# Patient Record
Sex: Female | Born: 2000 | Race: White | Hispanic: No | Marital: Single | Smoking: Never smoker
Health system: Southern US, Community
[De-identification: ages and names within clinical notes are randomized; demographics above are authoritative.]

## PROBLEM LIST (undated history)

## (undated) DIAGNOSIS — F319 Bipolar disorder, unspecified: Secondary | ICD-10-CM

## (undated) DIAGNOSIS — F840 Autistic disorder: Secondary | ICD-10-CM

## (undated) DIAGNOSIS — T1491XA Suicide attempt, initial encounter: Secondary | ICD-10-CM

## (undated) DIAGNOSIS — F09 Unspecified mental disorder due to known physiological condition: Secondary | ICD-10-CM

## (undated) DIAGNOSIS — H6690 Otitis media, unspecified, unspecified ear: Secondary | ICD-10-CM

## (undated) HISTORY — PX: TONSILLECTOMY: SUR1361

## (undated) HISTORY — PX: OTHER SURGICAL HISTORY: SHX169

---
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
## 2019-06-20 ENCOUNTER — Other Ambulatory Visit: Payer: Self-pay

## 2019-06-20 DIAGNOSIS — Z20822 Contact with and (suspected) exposure to covid-19: Secondary | ICD-10-CM

## 2019-06-22 LAB — NOVEL CORONAVIRUS, NAA: SARS-CoV-2, NAA: NOT DETECTED

## 2019-10-30 ENCOUNTER — Ambulatory Visit: Payer: Medicaid Other | Attending: Internal Medicine

## 2019-10-30 DIAGNOSIS — Z23 Encounter for immunization: Secondary | ICD-10-CM

## 2019-10-30 NOTE — Progress Notes (Signed)
   Covid-19 Vaccination Clinic  Name:  Tynlee Bayle    MRN: [id number] DOB: 03-19-2001  10/30/2019  Ms. Governale was observed post Covid-19 immunization for 15 minutes without incident. She was provided with Vaccine Information Sheet and instruction to access the V-Safe system.   Ms. Perham was instructed to call 911 with any severe reactions post vaccine: Marland Kitchen Difficulty breathing  . Swelling of face and throat  . A fast heartbeat  . A bad rash all over body  . Dizziness and weakness   Immunizations Administered    Name Date Dose VIS Date Route   Pfizer COVID-19 Vaccine 10/30/2019 10:50 AM 0.3 mL 07/07/2019 Intramuscular   Manufacturer: [employer], [employer]   Lot: [id number]   NDC: [address]-1

## 2019-11-22 ENCOUNTER — Ambulatory Visit: Payer: Medicaid Other | Attending: Internal Medicine

## 2019-11-22 DIAGNOSIS — Z23 Encounter for immunization: Secondary | ICD-10-CM

## 2019-11-22 NOTE — Progress Notes (Signed)
   Covid-19 Vaccination Clinic  Name:  Martha Harrison    MRN: [id number] DOB: 12-22-2000  11/22/2019  Martha Harrison was observed post Covid-19 immunization for 15 minutes without incident. She was provided with Vaccine Information Sheet and instruction to access the V-Safe system.   Martha Harrison was instructed to call 911 with any severe reactions post vaccine: Marland Kitchen Difficulty breathing  . Swelling of face and throat  . A fast heartbeat  . A bad rash all over body  . Dizziness and weakness   Immunizations Administered    Name Date Dose VIS Date Route   Pfizer COVID-19 Vaccine 11/22/2019 11:00 AM 0.3 mL 09/20/2018 Intramuscular   Manufacturer: [employer], [employer]   Lot: [id number]   NDC: [address]-3

## 2020-02-18 ENCOUNTER — Other Ambulatory Visit: Payer: Self-pay

## 2020-02-18 ENCOUNTER — Encounter (HOSPITAL_COMMUNITY): Payer: Self-pay | Admitting: Emergency Medicine

## 2020-02-18 ENCOUNTER — Emergency Department (HOSPITAL_COMMUNITY)
Admission: EM | Admit: 2020-02-18 | Discharge: 2020-02-19 | Disposition: A | Discharge status: Other Acute Inpatient Hospital | Payer: Medicaid Other | Attending: Emergency Medicine | Admitting: Emergency Medicine

## 2020-02-18 DIAGNOSIS — F321 Major depressive disorder, single episode, moderate: Secondary | ICD-10-CM | POA: Insufficient documentation

## 2020-02-18 DIAGNOSIS — R45851 Suicidal ideations: Secondary | ICD-10-CM | POA: Insufficient documentation

## 2020-02-18 DIAGNOSIS — Z20822 Contact with and (suspected) exposure to covid-19: Secondary | ICD-10-CM | POA: Insufficient documentation

## 2020-02-18 DIAGNOSIS — F840 Autistic disorder: Secondary | ICD-10-CM | POA: Diagnosis not present

## 2020-02-18 LAB — CBC WITH DIFFERENTIAL/PLATELET
Abs Immature Granulocytes: 0.02 10*3/uL (ref 0.00–0.07)
Basophils Absolute: 0.1 10*3/uL (ref 0.0–0.1)
Basophils Relative: 1 %
Eosinophils Absolute: 0.1 10*3/uL (ref 0.0–0.5)
Eosinophils Relative: 2 %
HCT: 38.8 % (ref 36.0–46.0)
Hemoglobin: 12.7 g/dL (ref 12.0–15.0)
Immature Granulocytes: 0 %
Lymphocytes Relative: 40 %
Lymphs Abs: 3.1 10*3/uL (ref 0.7–4.0)
MCH: 29.3 pg (ref 26.0–34.0)
MCHC: 32.7 g/dL (ref 30.0–36.0)
MCV: 89.4 fL (ref 80.0–100.0)
Monocytes Absolute: 0.7 10*3/uL (ref 0.1–1.0)
Monocytes Relative: 9 %
Neutro Abs: 3.8 10*3/uL (ref 1.7–7.7)
Neutrophils Relative %: 48 %
Platelets: 165 10*3/uL (ref 150–400)
RBC: 4.34 MIL/uL (ref 3.87–5.11)
RDW: 12.4 % (ref 11.5–15.5)
WBC: 7.8 10*3/uL (ref 4.0–10.5)
nRBC: 0 % (ref 0.0–0.2)

## 2020-02-18 LAB — ACETAMINOPHEN LEVEL: Acetaminophen (Tylenol), Serum: <10 ug/mL — ABNORMAL LOW (ref 10–30)

## 2020-02-18 LAB — COMPREHENSIVE METABOLIC PANEL
ALT: 16 U/L (ref 0–44)
AST: 19 U/L (ref 15–41)
Albumin: 3.8 g/dL (ref 3.5–5.0)
Alkaline Phosphatase: 51 U/L (ref 38–126)
Anion gap: 13 (ref 5–15)
BUN: 11 mg/dL (ref 6–20)
CO2: 21 mmol/L — ABNORMAL LOW (ref 22–32)
Calcium: 9.3 mg/dL (ref 8.9–10.3)
Chloride: 106 mmol/L (ref 98–111)
Creatinine, Ser: 0.51 mg/dL (ref 0.44–1.00)
GFR calc Af Amer: >60 mL/min (ref >60)
GFR calc non Af Amer: >60 mL/min (ref >60)
Glucose, Bld: 88 mg/dL (ref 70–99)
Potassium: 3.8 mmol/L (ref 3.5–5.1)
Sodium: 140 mmol/L (ref 135–145)
Total Bilirubin: 0.4 mg/dL (ref 0.3–1.2)
Total Protein: 7.2 g/dL (ref 6.5–8.1)

## 2020-02-18 LAB — CBC
HCT: 43.1 % (ref 36.0–46.0)
Hemoglobin: 14.1 g/dL (ref 12.0–15.0)
MCH: 29.6 pg (ref 26.0–34.0)
MCHC: 32.7 g/dL (ref 30.0–36.0)
MCV: 90.4 fL (ref 80.0–100.0)
Platelets: 159 10*3/uL (ref 150–400)
RBC: 4.77 MIL/uL (ref 3.87–5.11)
RDW: 12.4 % (ref 11.5–15.5)
WBC: 7.8 10*3/uL (ref 4.0–10.5)
nRBC: 0 % (ref 0.0–0.2)

## 2020-02-18 LAB — RAPID URINE DRUG SCREEN, HOSP PERFORMED
Amphetamines: NOT DETECTED
Barbiturates: NOT DETECTED
Benzodiazepines: NOT DETECTED
Cocaine: NOT DETECTED
Opiates: NOT DETECTED
Tetrahydrocannabinol: NOT DETECTED

## 2020-02-18 LAB — VALPROIC ACID LEVEL: Valproic Acid Lvl: 34 ug/mL — ABNORMAL LOW (ref 50.0–100.0)

## 2020-02-18 LAB — I-STAT BETA HCG BLOOD, ED (MC, WL, AP ONLY): I-stat hCG, quantitative: <5 m[IU]/mL (ref <5)

## 2020-02-18 LAB — ETHANOL: Alcohol, Ethyl (B): <10 mg/dL (ref <10)

## 2020-02-18 LAB — SALICYLATE LEVEL: Salicylate Lvl: <7 mg/dL — ABNORMAL LOW (ref 7.0–30.0)

## 2020-02-18 MED ORDER — FOLIC ACID 1 MG PO TABS
1.0000 mg | ORAL_TABLET | Freq: Every day | ORAL | Status: DC
  Administered 2020-02-18 – 2020-02-19 (×2): 1 mg via ORAL
  Filled 2020-02-18 (×2): qty 1

## 2020-02-18 MED ORDER — ADULT MULTIVITAMIN W/MINERALS CH
1.0000 | ORAL_TABLET | Freq: Every day | ORAL | Status: DC
  Administered 2020-02-18 – 2020-02-19 (×2): 1 via ORAL
  Filled 2020-02-18 (×2): qty 1

## 2020-02-18 MED ORDER — CLOZAPINE 100 MG PO TABS
250.0000 mg | ORAL_TABLET | Freq: Two times a day (BID) | ORAL | Status: DC
  Administered 2020-02-18 – 2020-02-19 (×2): 250 mg via ORAL
  Filled 2020-02-18 (×3): qty 3

## 2020-02-18 MED ORDER — DIVALPROEX SODIUM 125 MG PO CSDR
500.0000 mg | DELAYED_RELEASE_CAPSULE | Freq: Two times a day (BID) | ORAL | Status: DC
  Administered 2020-02-18 – 2020-02-19 (×2): 500 mg via ORAL
  Filled 2020-02-18 (×3): qty 4

## 2020-02-18 MED ORDER — ALPRAZOLAM 0.5 MG PO TABS
0.5000 mg | ORAL_TABLET | Freq: Every evening | ORAL | Status: DC | PRN
  Administered 2020-02-18: 0.5 mg via ORAL
  Filled 2020-02-18: qty 1

## 2020-02-18 MED ORDER — MELATONIN 5 MG PO TABS
2.5000 mg | ORAL_TABLET | Freq: Every day | ORAL | Status: DC
  Administered 2020-02-18: 2.5 mg via ORAL
  Filled 2020-02-18: qty 1

## 2020-02-18 MED ORDER — DOCUSATE SODIUM 100 MG PO CAPS
100.0000 mg | ORAL_CAPSULE | Freq: Every day | ORAL | Status: DC
  Administered 2020-02-18: 100 mg via ORAL
  Filled 2020-02-18: qty 1

## 2020-02-18 NOTE — ED Notes (Signed)
Pt's belonging bag placed in Locker [address]2.

## 2020-02-18 NOTE — ED Notes (Signed)
Pt's dinner ordered °

## 2020-02-18 NOTE — ED Notes (Signed)
Regular lunch tray ordered 

## 2020-02-18 NOTE — ED Provider Notes (Signed)
[facility] EMERGENCY DEPARTMENT Provider Note   CSN: [id number] Arrival date & time: 02/18/20  5643     History Chief Complaint  Patient presents with  . Suicidal    Martha Harrison is a 19 y.o. female.  HPI 19 year old female presents with suicidal thoughts.  History is taken from patient and dad at the bedside.  Patient has autism and often acts out.  Often when she gets upset she will have suicidal thoughts that typically go away by that night or the next morning.  This occurred yesterday and at one point she was agitated and threw her pill box.  Has been endorsing suicidal thoughts and these did not go away today.  Still feels suicidal.  She has not taken her meds since last night or this morning.  Has not had any signs/symptoms of illness otherwise. No plan for suicide.  Past Medical History:  Diagnosis Date  . Autism   . Bipolar 1 disorder (HCC)   . Cognitive disorder   . Otitis   . Suicide attempt [facility])     Patient Active Problem List   Diagnosis Date Noted  . Auditory hallucination   . Suicidal ideation   . Psychoses (HCC)   . Pervasive developmental disorder   . Depression 02/05/2015    Past Surgical History:  Procedure Laterality Date  . TONSILLECTOMY    . tubes in ears       OB History   No obstetric history on file.     No family history on file.  Social History   Tobacco Use  . Smoking status: Never Smoker  Substance Use Topics  . Alcohol use: No  . Drug use: No    Home Medications Prior to Admission medications   Medication Sig Start Date End Date Taking? Authorizing Provider  ALPRAZolam (XANAX) 0.25 MG tablet Take 0.25 mg by mouth 2 (two) times daily as needed. 01/26/20  Yes [provider]  cloZAPine (CLOZARIL) 100 MG tablet Take 250 mg by mouth 2 (two) times daily.    Yes [provider]  divalproex (DEPAKOTE ER) 500 MG 24 hr tablet Take 500 mg by mouth 2 (two) times daily. 02/12/20  Yes [provider]  docusate sodium (COLACE) 100 MG capsule Take 100 mg by mouth at bedtime.   Yes [provider]  folic acid (FOLVITE) 1 MG tablet Take 1 mg by mouth daily.   Yes [provider]  levOCARNitine L-Tartrate (L-CARNITINE) 500 MG CAPS Take 1,000 mg by mouth in the morning and at bedtime.   Yes [provider]  melatonin 5 MG TABS Take 5 mg by mouth at bedtime.    Yes [provider]  Multiple Vitamin (MULTIVITAMIN ADULT PO) Take 1 tablet by mouth daily.   Yes [provider]  senna (SENOKOT) 8.6 MG TABS tablet Take 2 tablets by mouth every evening.   Yes [provider]  SIMPESSE 0.15-0.03 &0.01 MG tablet Take 1 tablet by mouth daily. 01/26/20  Yes [provider]    Allergies    Patient has no known allergies.  Review of Systems   Review of Systems  Constitutional: Negative for fever.  Psychiatric/Behavioral: Positive for suicidal ideas.  All other systems reviewed and are negative.   Physical Exam Updated Vital Signs BP (!) 127/90 (BP Location: Right Arm)   Pulse (!) 126   Temp 97.6 F (36.4 C) (Oral)   Resp 16   Ht 4\' 11"  (1.499 m)  LMP 02/04/2020   SpO2 100%   Physical Exam Vitals and nursing note reviewed.  Constitutional:      General: She is not in acute distress.    Appearance: She is well-developed. She is not ill-appearing or diaphoretic.  HENT:     Head: Normocephalic and atraumatic.     Right Ear: External ear normal.     Left Ear: External ear normal.     Nose: Nose normal.  Eyes:     General:        Right eye: No discharge.        Left eye: No discharge.  Cardiovascular:     Rate and Rhythm: Regular rhythm. Tachycardia present.     Heart sounds: Normal heart sounds.  Pulmonary:     Effort: Pulmonary effort is normal.     Breath sounds: Normal breath sounds.  Abdominal:     General: There is no distension.  Skin:    General: Skin is warm and dry.  Neurological:     Mental  Status: She is alert.  Psychiatric:        Mood and Affect: Mood is not anxious.        Thought Content: Thought content includes suicidal ideation.     ED Results / Procedures / Treatments   Labs (all labs ordered are listed, but only abnormal results are displayed) Labs Reviewed  COMPREHENSIVE METABOLIC PANEL - Abnormal; Notable for the following components:      Result Value   CO2 21 (*)    All other components within normal limits  SALICYLATE LEVEL - Abnormal; Notable for the following components:   Salicylate Lvl <7.0 (*)    All other components within normal limits  ACETAMINOPHEN LEVEL - Abnormal; Notable for the following components:   Acetaminophen (Tylenol), Serum <10 (*)    All other components within normal limits  VALPROIC ACID LEVEL - Abnormal; Notable for the following components:   Valproic Acid Lvl 34 (*)    All other components within normal limits  SARS CORONAVIRUS 2 BY RT PCR (HOSPITAL ORDER, PERFORMED IN [place] [facility])  ETHANOL  CBC  RAPID URINE DRUG SCREEN, HOSP PERFORMED  CBC WITH DIFFERENTIAL/PLATELET  I-STAT BETA HCG BLOOD, ED ([facility], [facility], [facility] ONLY)    EKG None  Radiology No results found.  Procedures Procedures (including critical care time)  Medications Ordered in ED Medications  ALPRAZolam (XANAX) tablet 0.5 mg (has no administration in time range)  cloZAPine (CLOZARIL) tablet 150-200 mg (has no administration in time range)  divalproex (DEPAKOTE SPRINKLE) capsule 500 mg (has no administration in time range)  docusate sodium (COLACE) capsule 100 mg (has no administration in time range)  folic acid (FOLVITE) tablet 1 mg (has no administration in time range)  melatonin tablet 2.5 mg (has no administration in time range)  multivitamin with minerals tablet 1 tablet (has no administration in time range)    ED Course  I have reviewed the triage vital signs and the nursing notes.  Pertinent labs & imaging results that were available  during my care of the patient were reviewed by me and considered in my medical decision making (see chart for details).    MDM Rules/Calculators/A&P                          Patient is actively suicidal though no plan.  She is quite tachycardic though self describes her symptoms as anxiety.  She has been through  this before.  Dad is at the bedside.  We will restart her home meds and consult TTS.  Her heart rate is high though again that is probably from the anxiety as her other labs are benign.  The patient has been placed in psychiatric observation due to the need to provide a safe environment for the patient while obtaining psychiatric consultation and evaluation, as well as ongoing medical and medication management to treat the patient's condition.  The patient has not been placed under full IVC at this time.  Final Clinical Impression(s) / ED Diagnoses Final diagnoses:  None    Rx / DC Orders ED Discharge Orders    None       Pricilla Loveless, MD 02/18/20 [phone]

## 2020-02-18 NOTE — BH Assessment (Addendum)
Comprehensive Clinical Assessment (CCA) Screening, Triage and Referral Note  02/18/2020 Martha Harrison [id number]  Visit Diagnosis: F32.1, Major depressive disorder, Single episode, Moderate  Martha Harrison is a 19 year old patient who was brought to [facility] via her father (grandfather) due to pt expressing having thoughts about wanting to kill herself. Pt shares she was angry last night and, due to this, threw her pill box across the room. When asked what would happen were she to be discharged home, pt states she might yell, throw things, or bite myself.  Pt was unable to share why she was upset last night/why she threw her pill bottle. She shares she also refused to take her medication last night and this morning. Pt expressed frustration that she has to take medication "all the time and I'm sick of it!" Pt expressed an understanding that many people have to take medication multiple times/day to live and that she is not alone in this frustration; it's just something she might need to live with.  Pt gave verbal consent for clinician to contact her father, whom is also her legal guardian. Pt's father states pt gets upset at times, but that typically she will calm down after several hours and will feel better and that, eventually, she'll continue on as if nothing happened. Pt shares that, today, pt could not be convinced that she did not need to go to the hospital; she told clinician several times, "I don't feel safe at home" because she might react by biting herself, yelling, or throwing things.  Pt is oriented x5. Her recent and remote memory is intact. Pt was cooperative throughout the assessment, with the exception of becoming loud when discussing not wanting to return home. Pt's insight, judgement, and impulse control is fair - poor at this time.  Patient Reported Information How did you hear about [place]? Family/Friend   Referral name: Darlina Rumpf, grandfather   Referral phone number:  [phone]  Whom do you see for routine medical problems? Primary Care   Practice[facility]Facility Name: [facility]   Practice/Facility Phone Number: No data recorded  Name of Contact: [facility]   Contact Number: [facility]   Contact Fax Number: [facility]   Prescriber Name: [facility]   Prescriber Address (if known): [facility]  What Is the Reason for Your Visit/Call Today? [facility]  How Long Has This Been Causing You Problems? <Week  Have You Recently Been in Any Inpatient Treatment (Hospital/Detox/Crisis Center/28-Day Program)? No   Name/Location of Program/Hospital:No data recorded  How Long Were You There? No data recorded  When Were You Discharged? No data recorded Have You Ever Received Services From [facility] Before? No   Who Do You See at [facility]? No data recorded Have You Recently Had Any Thoughts About Hurting Yourself? Yes   Are You Planning to Commit Suicide/Harm Yourself At This time?  No  Have you Recently Had Thoughts About Hurting Someone Karolee Ohs? No   Explanation: No data recorded Have You Used Any Alcohol or Drugs in the Past 24 Hours? No   How Long Ago Did You Use Drugs or Alcohol?  No data recorded  What Did You Use and How Much? No data recorded What Do You Feel Would Help You the Most Today? Other (Comment) (Unsure)  Do You Currently Have a Therapist/Psychiatrist? Yes   Name of Therapist/Psychiatrist: Pt is currently seen at [facility] for psychiatry for the last 10 years.   Have You Been Recently Discharged From Any Office Practice or Programs? No   Explanation of  Discharge From Practice/Program:  No data recorded    CCA Screening Triage Referral Assessment Type of Contact: Tele-Assessment   Is this Initial or Reassessment? Initial Assessment   Date Telepsych consult ordered in [facility]:  No data recorded  Time Telepsych consult ordered in [facility]:  No data recorded Patient Reported Information Reviewed? No data recorded  Patient Left Without Being Seen? No data recorded  Reason for  Not Completing Assessment: No data recorded Collateral Involvement: No data recorded Does Patient Have a Court Appointed Legal Guardian? No data recorded  Name and Contact of Legal Guardian:  No data recorded If Minor and Not Living with Parent(s), Who has Custody? No data recorded Is CPS involved or ever been involved? No data recorded Is APS involved or ever been involved? No data recorded Patient Determined To Be At Risk for Harm To Self or Others Based on Review of Patient Reported Information or Presenting Complaint? No data recorded  Method: No data recorded  Availability of Means: No data recorded  Intent: No data recorded  Notification Required: No data recorded  Additional Information for Danger to Others Potential:  No data recorded  Additional Comments for Danger to Others Potential:  No data recorded  Are There Guns or Other Weapons in Your Home?  No data recorded   Types of Guns/Weapons: No data recorded   Are These Weapons Safely Secured?                              No data recorded   Who Could Verify You Are Able To Have These Secured:    No data recorded Do You Have any Outstanding Charges, Pending Court Dates, Parole/Probation? No data recorded Contacted To Inform of Risk of Harm To Self or Others: No data recorded Location of Assessment: No data recorded Does Patient Present under Involuntary Commitment? No data recorded  IVC Papers Initial File Date: No data recorded  [place] of Residence: No data recorded Patient Currently Receiving the Following Services: No data recorded  Determination of Need: No data recorded  Options For Referral: Shuvon Rankin, NP, reviewed pt's chart and information and determined pt should be observed overnight and re-assessed by psych in the morning. This information was provided to pt's nurse, Britt Boozer RN, at [phone].  Ralph Dowdy, LMFT

## 2020-02-18 NOTE — ED Notes (Signed)
Pt joyful and happy, chatting with all staff. Pt pleasant and cooperative at this time.

## 2020-02-18 NOTE — ED Notes (Signed)
Pt has changed into burgundy scrubs.  Belongings bagged, labeled, and removed from room.  Security notified to wand pt.

## 2020-02-18 NOTE — ED Triage Notes (Signed)
Pt states she has been stating that she is suicidal because she gets upset but has no intentions of acting on it.  Denies suicidal plan.  Dad reports pt hasn't taken medications since last night.  She threw her pill box and said she didn't want to take medications.  Pt had been requesting for dad to bring her to hospital but he was trying to wait until Monday to follow up with her doctor.  States that normally when pt has outburst she will wake up the next morning and be apologetic but this morning she continued to say she was suicidal and request to come to hospital.

## 2020-02-18 NOTE — ED Notes (Signed)
Per BH, pt will be observed overnight and be reassessed tomorrow.

## 2020-02-19 ENCOUNTER — Ambulatory Visit (HOSPITAL_COMMUNITY)
Admission: EM | Admit: 2020-02-19 | Discharge: 2020-02-20 | Disposition: A | Discharge status: Home / Self Care | Payer: Medicaid Other | Source: Home / Self Care

## 2020-02-19 ENCOUNTER — Other Ambulatory Visit: Payer: Self-pay

## 2020-02-19 DIAGNOSIS — Z915 Personal history of self-harm: Secondary | ICD-10-CM | POA: Insufficient documentation

## 2020-02-19 DIAGNOSIS — Z79899 Other long term (current) drug therapy: Secondary | ICD-10-CM | POA: Insufficient documentation

## 2020-02-19 DIAGNOSIS — R45851 Suicidal ideations: Secondary | ICD-10-CM

## 2020-02-19 DIAGNOSIS — F419 Anxiety disorder, unspecified: Secondary | ICD-10-CM | POA: Insufficient documentation

## 2020-02-19 DIAGNOSIS — F840 Autistic disorder: Secondary | ICD-10-CM | POA: Insufficient documentation

## 2020-02-19 DIAGNOSIS — Z793 Long term (current) use of hormonal contraceptives: Secondary | ICD-10-CM | POA: Insufficient documentation

## 2020-02-19 DIAGNOSIS — F314 Bipolar disorder, current episode depressed, severe, without psychotic features: Secondary | ICD-10-CM | POA: Insufficient documentation

## 2020-02-19 DIAGNOSIS — F09 Unspecified mental disorder due to known physiological condition: Secondary | ICD-10-CM | POA: Insufficient documentation

## 2020-02-19 LAB — SARS CORONAVIRUS 2 BY RT PCR (HOSPITAL ORDER, PERFORMED IN ~~LOC~~ HOSPITAL LAB): SARS Coronavirus 2: NEGATIVE

## 2020-02-19 MED ORDER — MAGNESIUM HYDROXIDE 400 MG/5ML PO SUSP: 30.0000 mL | Freq: Every day | ORAL | Status: DC | PRN

## 2020-02-19 MED ORDER — LEVONORGEST-ETH ESTRAD 91-DAY 0.15-0.03 &0.01 MG PO TABS: 1.0000 | ORAL_TABLET | Freq: Every day | ORAL | Status: DC

## 2020-02-19 MED ORDER — SENNA 8.6 MG PO TABS: 2.0000 | ORAL_TABLET | Freq: Every evening | ORAL | Status: DC

## 2020-02-19 MED ORDER — ADULT MULTIVITAMIN W/MINERALS CH
1.0000 | ORAL_TABLET | Freq: Every day | ORAL | Status: DC
  Administered 2020-02-20: 1 via ORAL
  Filled 2020-02-19: qty 1

## 2020-02-19 MED ORDER — DIVALPROEX SODIUM ER 500 MG PO TB24
500.0000 mg | ORAL_TABLET | Freq: Two times a day (BID) | ORAL | Status: DC
  Administered 2020-02-19 – 2020-02-20 (×2): 500 mg via ORAL
  Filled 2020-02-19 (×2): qty 1

## 2020-02-19 MED ORDER — FOLIC ACID 1 MG PO TABS
1.0000 mg | ORAL_TABLET | Freq: Every day | ORAL | Status: DC
  Filled 2020-02-19 (×2): qty 1

## 2020-02-19 MED ORDER — MELATONIN 5 MG PO TABS
5.0000 mg | ORAL_TABLET | Freq: Every day | ORAL | Status: DC
  Administered 2020-02-19: 5 mg via ORAL
  Filled 2020-02-19: qty 1

## 2020-02-19 MED ORDER — DOCUSATE SODIUM 100 MG PO CAPS
100.0000 mg | ORAL_CAPSULE | Freq: Every day | ORAL | Status: DC
  Administered 2020-02-19: 100 mg via ORAL
  Filled 2020-02-19: qty 1

## 2020-02-19 MED ORDER — ACETAMINOPHEN 325 MG PO TABS: 650.0000 mg | ORAL_TABLET | Freq: Four times a day (QID) | ORAL | Status: DC | PRN

## 2020-02-19 MED ORDER — L-CARNITINE 500 MG PO CAPS: 1000.0000 mg | ORAL_CAPSULE | Freq: Two times a day (BID) | ORAL | Status: DC

## 2020-02-19 MED ORDER — CLOZAPINE 25 MG PO TABS
250.0000 mg | ORAL_TABLET | Freq: Two times a day (BID) | ORAL | Status: DC
  Administered 2020-02-19 – 2020-02-20 (×2): 250 mg via ORAL
  Filled 2020-02-19 (×2): qty 2

## 2020-02-19 MED ORDER — ALUM & MAG HYDROXIDE-SIMETH 200-200-20 MG/5ML PO SUSP: 30.0000 mL | ORAL | Status: DC | PRN

## 2020-02-19 MED ORDER — ALPRAZOLAM 0.25 MG PO TABS
0.2500 mg | ORAL_TABLET | Freq: Every evening | ORAL | Status: DC | PRN
  Administered 2020-02-19: 0.25 mg via ORAL
  Filled 2020-02-19: qty 1

## 2020-02-19 NOTE — ED Triage Notes (Signed)
Pt transferred from [facility] via [employer]. Endorsing VH of monster with big mouth in doorway of assessment room as well as anxiety. Denies SI at this time.

## 2020-02-19 NOTE — ED Notes (Signed)
Lunch ordered 

## 2020-02-19 NOTE — ED Notes (Signed)
Report called to [facility] and given to [place], [place]

## 2020-02-19 NOTE — ED Notes (Signed)
Pt eating dinner at this time

## 2020-02-19 NOTE — ED Provider Notes (Signed)
Emergency Medicine Observation Re-evaluation Note  Martha Harrison is a 19 y.o. female, seen on rounds today.  Pt initially presented to the ED for complaints of Suicidal Currently, the patient is sleeping.  Physical Exam  BP 120/78 (BP Location: Right Arm)   Pulse (!) 114   Temp 98.1 F (36.7 C) (Oral)   Resp 18   Ht 4\' 11"  (1.499 m)   LMP 02/04/2020   SpO2 94%  Physical Exam Constitutional:      Comments: sleeping  Pulmonary:     Effort: Pulmonary effort is normal.     ED Course / MDM  EKG:    I have reviewed the labs performed to date as well as medications administered while in observation. Patient is noted to have continued documented tachycardia. Discussed with RN caring for patient. It is reported that HR normalizes after administration of xanax, appears to be attributed to anxiety. Recheck this morning is 101. Requested RN document VS.  Plan  Psych evaluated patient this morning. Current plan is for transfer to [facility] for stabilization, pending COVID test result. Patient is not under full IVC at this time.   Maximilian Tallo, [facility], PA-C 02/19/20 1225    02/21/20, MD 02/19/20 1747

## 2020-02-19 NOTE — Progress Notes (Addendum)
2nd shift [facility] WED CSW (acting as disposition today) received a call from the 2nd shift ED CSW at [facility] that pt's grandfather (and acting father, per the [facility] ED CSW) is upset that no one has contacted him regarding pt's disposition.  CSW will continue to follow for D/C needs.  Dorothe Pea. Jasmeet Manton  MSW, LCSW, LCAS, CCS Transitions of Care Clinical Social Worker Care Coordination Department Ph: [phone]

## 2020-02-19 NOTE — ED Notes (Signed)
Multiple attempts to contact [facility] to give report or find out when she can come; unsuccessful at getting thru

## 2020-02-19 NOTE — ED Notes (Signed)
Pt belongings in locker #1. 

## 2020-02-19 NOTE — ED Notes (Addendum)
RN called [facility] to check on covid test results. They say they dont have specimen. RN will recollect

## 2020-02-19 NOTE — Progress Notes (Signed)
REMS Clozapine program Update: Patient & MD registered; Labs entered and acceptable.  Per Clozapine REMS program, acceptable to continue medication. ANC monitoring required every 4 weeks as outpatient.    Junita Push, PharmD, BCPS 02/19/2020@10 :15 PM

## 2020-02-19 NOTE — Progress Notes (Addendum)
CSW called pt's grandfather and legal guardian Martha Harrison and legal guardian who has concerns that it is to be understood that he is the legal guardian and changes regarding pt's disposition and medication are not to be changed without his consent as there have been problems with this having been done before and that pt's current tx which has been markedly better than in the past are the result of the medication changes that were implemented at [facility] and were approved by pt's current psychiatrist.  Per pt's Grandfather/LG, pt's current provider (psychiatrist)  info in the community can be obtained from him (LG)  at ph: [phone].  6:18 PM Psychiatry updated at [facility].  CSW will continue to follow for D/C needs.  Martha Harrison. Martha Harrison  MSW, LCSW, LCAS, CCS Transitions of Care Clinical Social Worker Care Coordination Department Ph: [phone]

## 2020-02-19 NOTE — ED Provider Notes (Addendum)
Behavioral Health Admission H&P [facility](FBC & OBS)  Date: 02/20/20 Patient Name: Martha Harrison MRN: [id number] Chief Complaint:  Chief Complaint  Patient presents with  . Suicidal  . Anxiety  . Hallucinations      Diagnoses:  Final diagnoses:  Bipolar 1 disorder, depressed, severe (HCC)  Suicidal ideation    HPI: Martha Barrioslizabeth Bambach is a 19 year old female who presented to [facility] due to suicidal thoughts.  She has a history of autism, bipolar 1 disorder, cognitive disorder, and previous suicide attempt.  [facility].  From Psychiatric Evaluation 02/19/2020:  Psychiatric evaluation: Martha Barrioslizabeth Bonaventure is a 19 year old female with a history significantly for Autism, Bipolar 1 disorder, Cognitive disorder, and suicide attmpt per chart review.  [facility] and per chart review, it was noted that when she gets upset she will have suicidal thoughts that typically go away by that night or the next morning. During this evaluation, patient was alert and oriented, calm and cooperative although cognitive limitations were noted making her a poor historian. She continued to endorse suicidal thoughts although stated," I don't have a plan." She did however state," If I go home I will try to hurt myself because I don't feel safe being there." She could not explain why she did not feel safe yet, continued to refer to feeling upset and throwing her box  of pills prior to going to the ED. It was difficult for her to express what caused her to feel upset although she did mention that she was frustrated with her medications. When asked if she had tried to harm herself in the past she replied," A long time ago when I watched five nights of Freddie. That's when I scratched myself and that's when I had illusions and had to be sent to 7 hospitals." Again, I do believe that patients cognitive limitations are so severe that she may not be able to  understand some of the questions. She denied homicidal thoughts and AVH. She stated that she receives outpatient psychiatric services but is unable to say with whom. She denied substance abuse or use.    On evaluation this evening, the patient is alert and oriented x4.  Her speech is clear and coherent, normal pace, decreased in volume.  She reports her mood is depressed and anxious.  Her affect is congruent with mood.  She reports that [facility] that she saw a character with a wide mouth.  She states that she is now tired and ready for bed, so she is not having visual hallucinations.  She denies auditory hallucinations.  She does not appear to be responding to internal stimuli.  She continues to endorse suicidal thoughts.  Denies any intent or plan.  She is able to verbally contract for safety while in the facility.  She denies homicidal ideations.  She denies substance abuse.  Urine drug screen negative.  She requests to end the evaluation, so she can get some sleep.  Per chart review the patient's grandfather, Darlina RumpfDavid Cohen, is the legal guardian and request any changes to medications or disposition be discussed with him prior to any changes.  PHQ 2-9:     [facility]  C-SSRS RISK CATEGORY Low Risk Low Risk       Total Time spent with patient: 15 minutes  Musculoskeletal  Strength &  Muscle Tone: within normal limits Gait & Station: normal Patient leans: N/A  Psychiatric Specialty Exam  Presentation General Appearance: Casual;Fairly Groomed  Eye Contact:Minimal  Speech:Clear and Coherent;Normal Rate  Speech Volume:Decreased  Handedness:No data recorded  Mood and Affect  Mood:Depressed;Anxious  Affect:Congruent;Depressed   Thought Process  Thought Processes:Coherent;Linear  Descriptions of Associations:Intact  Orientation:Full (Time,  Place and Person)  Thought Content:Logical  Hallucinations:Hallucinations: Visual;Other (comment) (Denies auditory hallucinations) Description of Visual Hallucinations: states that she saw a character with a wide mouth when she first arrived to [facility]. States now that she is tired and sleepy she does not have any hallucinations  Ideas of Reference:None  Suicidal Thoughts:Suicidal Thoughts: No  Homicidal Thoughts:Homicidal Thoughts: No   Sensorium  Memory:Immediate Good;Recent Fair;Remote Fair  Judgment:Intact  Insight:Present   Executive Functions  Concentration:Fair  Attention Span:Fair  Recall:Fair  Fund of Knowledge:Fair  Language:Good   Psychomotor Activity  Psychomotor Activity:Psychomotor Activity: Normal   Assets  Assets:Communication Skills;Desire for Improvement;Financial Resources/Insurance;Housing;Leisure Time;Physical Health   Sleep  Sleep:Sleep: Fair   Physical Exam Constitutional:      General: She is not in acute distress.    Appearance: She is obese. She is not ill-appearing, toxic-appearing or diaphoretic.  HENT:     Head: Normocephalic.     Right Ear: External ear normal.     Left Ear: External ear normal.  Eyes:     Pupils: Pupils are equal, round, and reactive to light.  Cardiovascular:     Rate and Rhythm: Normal rate.  Pulmonary:     Effort: Pulmonary effort is normal. No respiratory distress.  Musculoskeletal:        General: Normal range of motion.  Neurological:     Mental Status: She is alert and oriented to person, place, and time.  Psychiatric:        Attention and Perception: She perceives visual hallucinations.        Mood and Affect: Mood is anxious and depressed.        Thought Content: Thought content is not paranoid or delusional. Thought content does not include homicidal or suicidal ideation.    Review of Systems  Constitutional: Negative for chills, diaphoresis, fever, malaise/fatigue and weight loss.  HENT:  Negative for congestion.   Respiratory: Negative for cough and shortness of breath.   Cardiovascular: Negative for chest pain and palpitations.  Gastrointestinal: Negative for diarrhea, nausea and vomiting.  Neurological: Negative for dizziness.  Psychiatric/Behavioral: Positive for depression, hallucinations and suicidal ideas. Negative for memory loss and substance abuse. The patient is nervous/anxious and has insomnia.   All other systems reviewed and are negative.   Blood pressure (!) 120/88, pulse (!) 128, temperature 97.8 F (36.6 C), temperature source Oral, resp. rate 18, last menstrual period 02/04/2020, SpO2 98 %. There is no height or weight on file to calculate BMI.  Past Psychiatric History:   Is the patient at risk to self? Yes  Has the patient been a risk to self in the past 6 months? Yes .    Has the patient been a risk to self within the distant past? Yes   Is the patient a risk to others? No   Has the patient been a risk to others in the past 6 months? No   Has the patient been a risk to others within the distant past? No   Past Medical History:  Past Medical History:  Diagnosis Date  . Autism   . Bipolar 1 disorder (HCC)   . Cognitive disorder   .  Otitis   . Suicide attempt [facility])     Past Surgical History:  Procedure Laterality Date  . TONSILLECTOMY    . tubes in ears      Family History: No family history on file.  Social History:  Social History   Socioeconomic History  . Marital status: Single    Spouse name: Not on file  . Number of children: Not on file  . Years of education: Not on file  . Highest education level: Not on file  Occupational History  . Not on file  Tobacco Use  . Smoking status: Never Smoker  Substance and Sexual Activity  . Alcohol use: No  . Drug use: No  . Sexual activity: Never  Other Topics Concern  . Not on file  Social History Narrative  . Not on file   Social Determinants of Health   Financial Resource  Strain:   . Difficulty of Paying Living Expenses:   Food Insecurity:   . Worried About Programme researcher, broadcasting/film/video in the Last Year:   . Barista in the Last Year:   Transportation Needs:   . Freight forwarder (Medical):   Marland Kitchen Lack of Transportation (Non-Medical):   Physical Activity:   . Days of Exercise per Week:   . Minutes of Exercise per Session:   Stress:   . Feeling of Stress :   Social Connections:   . Frequency of Communication with Friends and Family:   . Frequency of Social Gatherings with Friends and Family:   . Attends Religious Services:   . Active Member of Clubs or Organizations:   . Attends Banker Meetings:   Marland Kitchen Marital Status:   Intimate Partner Violence:   . Fear of Current or Ex-Partner:   . Emotionally Abused:   Marland Kitchen Physically Abused:   . Sexually Abused:     SDOH:  SDOH Screenings   Alcohol Screen:   . Last Alcohol Screening Score (AUDIT):   Depression (PHQ2-9):   . PHQ-2 Score:   Financial Resource Strain:   . Difficulty of Paying Living Expenses:   Food Insecurity:   . Worried About Programme researcher, broadcasting/film/video in the Last Year:   . [employer] of Food in the Last Year:   Housing:   . Last Housing Risk Score:   Physical Activity:   . Days of Exercise per Week:   . Minutes of Exercise per Session:   Social Connections:   . Frequency of Communication with Friends and Family:   . Frequency of Social Gatherings with Friends and Family:   . Attends Religious Services:   . Active Member of Clubs or Organizations:   . Attends Banker Meetings:   Marland Kitchen Marital Status:   Stress:   . Feeling of Stress :   Tobacco Use: Unknown  . Smoking Tobacco Use: Never Smoker  . Smokeless Tobacco Use: Unknown  Transportation Needs:   . Lack of Transportation (Medical):   Marland Kitchen Lack of Transportation (Non-Medical):     Last Labs:  Admission on 02/18/2020, Discharged on 02/19/2020  Component Date Value Ref Range Status  . Sodium 02/18/2020 140  135 -  145 mmol/L Final  . Potassium 02/18/2020 3.8  3.5 - 5.1 mmol/L Final  . Chloride 02/18/2020 106  98 - 111 mmol/L Final  . CO2 02/18/2020 21* 22 - 32 mmol/L Final  . Glucose, Bld 02/18/2020 88  70 - 99 mg/dL Final   Glucose reference range applies only  to samples taken after fasting for at least 8 hours.  . BUN 02/18/2020 11  6 - 20 mg/dL Final  . Creatinine, Ser 02/18/2020 0.51  0.44 - 1.00 mg/dL Final  . Calcium 17/40/8144 9.3  8.9 - 10.3 mg/dL Final  . Total Protein 02/18/2020 7.2  6.5 - 8.1 g/dL Final  . Albumin 81/85/6314 3.8  3.5 - 5.0 g/dL Final  . AST 97/08/6376 19  15 - 41 U/L Final  . ALT 02/18/2020 16  0 - 44 U/L Final  . Alkaline Phosphatase 02/18/2020 51  38 - 126 U/L Final  . Total Bilirubin 02/18/2020 0.4  0.3 - 1.2 mg/dL Final  . GFR calc non Af Amer 02/18/2020 >60  >60 mL/min Final  . GFR calc Af Amer 02/18/2020 >60  >60 mL/min Final  . Anion gap 02/18/2020 13  5 - 15 Final   Performed at [facility], [address], [place], [place] [address]  . Alcohol, Ethyl (B) 02/18/2020 <10  <10 mg/dL Final   Comment: (NOTE) Lowest detectable limit for serum alcohol is 10 mg/dL.  For medical purposes only. Performed at [facility], [address] [place], [place] [address]   . Salicylate Lvl 02/18/2020 <7.0* 7.0 - 30.0 mg/dL Final   Performed at [facility], [address], [place], [place] [address]  . Acetaminophen (Tylenol), Serum 02/18/2020 <10* 10 - 30 ug/mL Final   Comment: (NOTE) Therapeutic concentrations vary significantly. A range of 10-30 ug/mL  may be an effective concentration for many patients. However, some  are best treated at concentrations outside of this range. Acetaminophen concentrations >150 ug/mL at 4 hours after ingestion  and >50 ug/mL at 12 hours after ingestion are often associated with  toxic reactions.  Performed at [facility], [address], [place], [place] [address]   . WBC 02/18/2020 7.8  4.0 - 10.5 K/uL  Final  . RBC 02/18/2020 4.77  3.87 - 5.11 MIL/uL Final  . Hemoglobin 02/18/2020 14.1  12.0 - 15.0 g/dL Final  . HCT 94/70/9628 43.1  36 - 46 % Final  . MCV 02/18/2020 90.4  80.0 - 100.0 fL Final  . MCH 02/18/2020 29.6  26.0 - 34.0 pg Final  . MCHC 02/18/2020 32.7  30.0 - 36.0 g/dL Final  . RDW 36/62/9476 12.4  11.5 - 15.5 % Final  . Platelets 02/18/2020 159  150 - 400 K/uL Final  . nRBC 02/18/2020 0.0  0.0 - 0.2 % Final   Performed at [facility], [address], [place], [place] [address]  . Opiates 02/18/2020 NONE DETECTED  NONE DETECTED Final  . Cocaine 02/18/2020 NONE DETECTED  NONE DETECTED Final  . Benzodiazepines 02/18/2020 NONE DETECTED  NONE DETECTED Final  . Amphetamines 02/18/2020 NONE DETECTED  NONE DETECTED Final  . Tetrahydrocannabinol 02/18/2020 NONE DETECTED  NONE DETECTED Final  . Barbiturates 02/18/2020 NONE DETECTED  NONE DETECTED Final   Comment: (NOTE) DRUG SCREEN FOR MEDICAL PURPOSES ONLY.  IF CONFIRMATION IS NEEDED FOR ANY PURPOSE, NOTIFY LAB WITHIN 5 DAYS.  LOWEST DETECTABLE LIMITS FOR URINE DRUG SCREEN Drug Class                     Cutoff (ng/mL) Amphetamine and metabolites    1000 Barbiturate and metabolites    200 Benzodiazepine                 200 Tricyclics and metabolites     300  Opiates and metabolites        300 Cocaine and metabolites        300 THC                            50 Performed at [facility], [address], [place], [place] [address]   . I-stat hCG, quantitative 02/18/2020 <5.0  <5 mIU/mL Final  . Comment 3 02/18/2020          Final   Comment:   GEST. AGE      CONC.  (mIU/mL)   <=1 WEEK        5 - 50     2 WEEKS       50 - 500     3 WEEKS       100 - 10,000     4 WEEKS     1,000 - 30,000        FEMALE AND NON-PREGNANT FEMALE:     LESS THAN 5 mIU/mL   . Valproic Acid Lvl 02/18/2020 34* 50.0 - 100.0 ug/mL Final   Performed at [facility], [address] [place], [place] [address]  . SARS Coronavirus 2  02/19/2020 NEGATIVE  NEGATIVE Final   Comment: (NOTE) SARS-CoV-2 target nucleic acids are NOT DETECTED.  The SARS-CoV-2 RNA is generally detectable in upper and lower respiratory specimens during the acute phase of infection. The lowest concentration of SARS-CoV-2 viral copies this assay can detect is 250 copies / mL. A negative result does not preclude SARS-CoV-2 infection and should not be used as the sole basis for treatment or other patient management decisions.  A negative result may occur with improper specimen collection / handling, submission of specimen other than nasopharyngeal swab, presence of viral mutation(s) within the areas targeted by this assay, and inadequate number of viral copies (<250 copies / mL). A negative result must be combined with clinical observations, patient history, and epidemiological information.  Fact Sheet for Patients:   BoilerBrush.com.cy  Fact Sheet for Healthcare Providers: https://pope.com/  This test is not yet approved or                           cleared by the [place] FDA and has been authorized for detection and/or diagnosis of SARS-CoV-2 by FDA under an Emergency Use Authorization (EUA).  This EUA will remain in effect (meaning this test can be used) for the duration of the COVID-19 declaration under Section 564(b)(1) of the Act, 21 U.S.C. section 360bbb-3(b)(1), unless the authorization is terminated or revoked sooner.  Performed at [facility], [address] [place], [place] [address]   . WBC 02/18/2020 7.8  4.0 - 10.5 K/uL Final  . RBC 02/18/2020 4.34  3.87 - 5.11 MIL/uL Final  . Hemoglobin 02/18/2020 12.7  12.0 - 15.0 g/dL Final  . HCT 62/13/0865 38.8  36 - 46 % Final  . MCV 02/18/2020 89.4  80.0 - 100.0 fL Final  . MCH 02/18/2020 29.3  26.0 - 34.0 pg Final  . MCHC 02/18/2020 32.7  30.0 - 36.0 g/dL Final  . RDW 78/46/9629 12.4  11.5 - 15.5 % Final  . Platelets  02/18/2020 165  150 - 400 K/uL Final  . nRBC 02/18/2020 0.0  0.0 - 0.2 % Final  . Neutrophils Relative % 02/18/2020 48  % Final  . Neutro Abs 02/18/2020 3.8  1.7 -  7.7 K/uL Final  . Lymphocytes Relative 02/18/2020 40  % Final  . Lymphs Abs 02/18/2020 3.1  0.7 - 4.0 K/uL Final  . Monocytes Relative 02/18/2020 9  % Final  . Monocytes Absolute 02/18/2020 0.7  0 - 1 K/uL Final  . Eosinophils Relative 02/18/2020 2  % Final  . Eosinophils Absolute 02/18/2020 0.1  0 - 0 K/uL Final  . Basophils Relative 02/18/2020 1  % Final  . Basophils Absolute 02/18/2020 0.1  0 - 0 K/uL Final  . Immature Granulocytes 02/18/2020 0  % Final  . Abs Immature Granulocytes 02/18/2020 0.02  0.00 - 0.07 K/uL Final   Performed at [facility], [address], [place], [place] [address]    Allergies: Patient has no known allergies.  PTA Medications: (Not in a hospital admission)   Medical Decision Making  Vana Arif is a 19 year old female who presented to [facility] due to suicidal thoughts.  She has a history of autism, bipolar 1 disorder, cognitive disorder, and previous suicide attempt.  She was transferred to [facility] for continuous assessment. Patient was medically cleared in the emergency department.  Resume home medications  Alprazolam 0.25 mg nightly as needed for anxiety Clozapine 250 mg twice daily for bipolar disorder/mood stability/suicidal thoughts Depakote ER  500 mg twice daily for mood stability Colace 100 mg daily at bedtime for constipation prevention Folic Acid 1 mg daily for nutritional supplementation MVI for nutritional supplementation Melatonin 5 mg nightly for sleep  Senokot 2 tablets nightly for constipation prevention  l-carnitine 1000 mg twice daily      Recommendations  Based on my evaluation the patient does not appear to have an emergency medical condition.  Jackelyn Poling, NP 02/20/20  3:24 AM

## 2020-02-19 NOTE — ED Notes (Signed)
Pt resting in pull out chair. Endorses inability to feel legs. Pt able to move legs on command but states while looking and looking away that she can not feel this writer's touch. Pedal pulses strong bilaterally. Pt offered reassurance that legs appeared to be okay and that if sensations continued to address with practitioner  In am.

## 2020-02-19 NOTE — ED Notes (Signed)
Rn called [facility] and spoke with Devon at this time to see about dispo. He will get in touch with nurses and have them call.

## 2020-02-19 NOTE — Progress Notes (Addendum)
CSW reviewed chart and sees per psychiatry pt is to be transferred to the [facility] at [address], in [place].  Per the psychiatric NP's note from today  Per notes, [facility] ED CSW is awaiting return call from [facility] on when pt can come and when report is to be called for transfer.  CSW called non-emergency dispatch and the number for [facility] was provided at ph: [phone].  CSW was transferred to the CN for observation unit at [facility] and spoke to a clinician in assessment and the psychiatric NP who states pt can be transferred after 7pm by pt's RN and to call report to observation to: [phone].  RN updated.  CSW to update pt's father/LG.  CSW will continue to follow for D/C needs.  Dorothe Pea. Anahlia Iseminger  MSW, LCSW, LCAS, CCS Transitions of Care Clinical Social Worker Care Coordination Department Ph: [phone]

## 2020-02-19 NOTE — ED Notes (Signed)
Spoke with pt's guardian, Darlina Rumpf [phone] to verify meds/ intake information and answer any questions. Unit # provided

## 2020-02-19 NOTE — ED Notes (Signed)
Lunch Tray Ordered @ 1027. °

## 2020-02-19 NOTE — Progress Notes (Signed)
Patient ID: Martha Harrison, female   DOB: 2001/02/02, 19 y.o.   MRN: [id number]   Psychiatric reassessment   HPI: Martha Harrison is a 19 year old patient who was brought to [facility] via her father (grandfather) due to pt expressing having thoughts about wanting to kill herself. Pt shares she was angry last night and, due to this, threw her pill box across the room. When asked what would happen were she to be discharged home, pt states she might yell, throw things, or bite myself.  Pt was unable to share why she was upset last night/why she threw her pill bottle. She shares she also refused to take her medication last night and this morning. Pt expressed frustration that she has to take medication "all the time and I'm sick of it!" Pt expressed an understanding that many people have to take medication multiple times/day to live and that she is not alone in this frustration; it's just something she might need to live with.  Pt gave verbal consent for clinician to contact her father, whom is also her legal guardian. Pt's father states pt gets upset at times, but that typically she will calm down after several hours and will feel better and that, eventually, she'll continue on as if nothing happened. Pt shares that, today, pt could not be convinced that she did not need to go to the hospital; she told clinician several times, "I don't feel safe at home" because she might react by biting herself, yelling, or throwing things.  Psychiatric evaluation: Martha Harrison is a 19 year old female with a history significantly for Autism, Bipolar 1 disorder, Cognitive disorder, and suicide attmpt per chart review.  Patient presented to [facility] with suicidal thoughts and per chart review, it was noted that when she gets upset she will have suicidal thoughts that typically go away by that night or the next morning.  During this evaluation, patient was alert and oriented, calm and cooperative although cognitive limitations  were noted making her a poor historian. She continued to endorse suicidal thoughts although stated," I don't have a plan." She did however state," If I go home I will try to hurt myself because I don't feel safe being there." She could not explain why she did not feel safe yet, continued to refer to feeling upset and throwing her box  of pills prior to going to the ED. It was difficult for her to express what caused her to feel upset although she did mention that she was frustrated with her medications. When asked if she had tried to harm herself in the past she replied," A long time ago when I watched five nights of Freddie. That's when I scratched myself and that's when I had illusions and had to be sent to 7 hospitals." Again, I do believe that patients cognitive limitations are so severe that she may not be able to understand some of the questions. She denied homicidal thoughts and AVH. She stated that she receives outpatient psychiatric services but is unable to say with whom. She denied substance abuse or use.   Disposition: Patient unable to contract for saftey at this time stating that if she goes home, she would try to harm herself. Per Dr. Lucianne Muss, patient to be transferred to the [facility] center for safety and stabilization. Patient twill be reassessed by psychiatry in the morning.

## 2020-02-19 NOTE — ED Notes (Signed)
[facility] called to transport pt to [facility] 

## 2020-02-20 MED ORDER — ALPRAZOLAM 0.25 MG PO TABS: 0.2500 mg | ORAL_TABLET | Freq: Two times a day (BID) | ORAL | Status: DC | PRN

## 2020-02-20 NOTE — ED Provider Notes (Signed)
FBC/OBS ASAP Discharge Summary  Date and Time: 02/20/2020 8:41 AM  Name: Martha Harrison  MRN:  [id number]   Discharge Diagnoses:  Final diagnoses:  Bipolar 1 disorder, depressed, severe (HCC)  Suicidal ideation    Subjective: Patient reports today that she is doing okay.  At first patient states that she is not ready to go home.  She states that she got upset about 4 days ago and was irritated and she threw her pillbox and then she threw a bottle.  Patient states that she did not feel like she was ready to go home just yet.  Patient reported having numerous coping skills but the main one being her stuffed animals.  She reports that she lives at home with her mom and dad, which is her legal guardian and is her grandmother and grandfather.  Patient is unsure of her medications or doses and states that those are questions to ask her legal guardians.  Patient is informed that we will discuss with her legal guardians but the next step would be.  And patient states that "if they will decide I need to stay but now stay, but if you think that I can go home that I feel safe going home with my parents."  Patient does not report any suicidal or homicidal ideations and denies any hallucinations. Patient legal guardian is contacted for collateral information.  Patient grandfather states that the patient has been doing extremely well since she was admitted to [facility] about 3 to 4 years ago.  He states this when she was started on her Clozaril and Depakote.  He states that she has been doing really good until recently and had a severe outburst.  He states that he is curious if her medications have reached their peak and that they need to be adjusted.  He reports that she did not take her doses on Saturday night or Sunday morning and he is unsure of exactly when she got a dose of her medications when she arrived at the hospital.  He did discuss reaching out to her psychiatrist for a sooner appointment as she sees her  psychiatrist monthly.  He states he will discussed with psychiatrist about changing the medications and feels that she does not need to have them increased here.  He also reports that she sees a Veterinary surgeoncounselor weekly.  Patient's legal guardian also reports that she is diagnosed with schizoaffective disorder while [facility].  He also reports that the patient is a poor historian and that she has a history of requesting to stay in hospitals for unknown reasons.  He also reports that the patient has been taking her Xanax twice a day as needed mainly with her taking 1 in the morning.  He states that if she is stating that she is okay to come home and that he feels safe bringing her to the house.  Stay Summary: [facility] and to continue her medications.  Patient slept well and this morning is reporting feeling better.  She did not report any suicidal or homicidal ideations and denied any hallucinations.  Patient's medications were not altered and patient's legal guardian was notified and agreed to follow-up with her outpatient psychiatrist for medication management.  Patient did not take her medications for Saturday night or Sunday morning and feel that this could have been a possible cause for her decompensation.  Patient is a poor historian.  Patient also has a history  of requesting to stay in hospital was for unknown reasons, possibly for respite from home.  Patient does not appear to be a threat to herself or to anyone else.  Patient has not been aggressive or agitated while she has been on the unit.  Patient will discharge home with her legal guardian with which a safety plan has been established.  Total Time spent with patient: 30 minutes  Past Psychiatric History: Autism, Bipolar disorder, previous suicide attempt due to [facility] telling her to take pills, schizoaffective Past Medical History:  Past Medical History:  Diagnosis Date  . Autism   . Bipolar 1  disorder (HCC)   . Cognitive disorder   . Otitis   . Suicide attempt [facility])     Past Surgical History:  Procedure Laterality Date  . TONSILLECTOMY    . tubes in ears     Family History: No family history on file. Family Psychiatric History: None reported Social History:  Social History   Substance and Sexual Activity  Alcohol Use No     Social History   Substance and Sexual Activity  Drug Use No    Social History   Socioeconomic History  . Marital status: Single    Spouse name: Not on file  . Number of children: Not on file  . Years of education: Not on file  . Highest education level: Not on file  Occupational History  . Not on file  Tobacco Use  . Smoking status: Never Smoker  Substance and Sexual Activity  . Alcohol use: No  . Drug use: No  . Sexual activity: Never  Other Topics Concern  . Not on file  Social History Narrative  . Not on file   Social Determinants of Health   Financial Resource Strain:   . Difficulty of Paying Living Expenses:   Food Insecurity:   . Worried About Programme researcher, broadcasting/film/video in the Last Year:   . Barista in the Last Year:   Transportation Needs:   . Freight forwarder (Medical):   Martha Kitchen Lack of Transportation (Non-Medical):   Physical Activity:   . Days of Exercise per Week:   . Minutes of Exercise per Session:   Stress:   . Feeling of Stress :   Social Connections:   . Frequency of Communication with Friends and Family:   . Frequency of Social Gatherings with Friends and Family:   . Attends Religious Services:   . Active Member of Clubs or Organizations:   . Attends Banker Meetings:   Martha Kitchen Marital Status:    SDOH:  SDOH Screenings   Alcohol Screen:   . Last Alcohol Screening Score (AUDIT):   Depression (PHQ2-9):   . PHQ-2 Score:   Financial Resource Strain:   . Difficulty of Paying Living Expenses:   Food Insecurity:   . Worried About Programme researcher, broadcasting/film/video in the Last Year:   . [employer] of Food in  the Last Year:   Housing:   . Last Housing Risk Score:   Physical Activity:   . Days of Exercise per Week:   . Minutes of Exercise per Session:   Social Connections:   . Frequency of Communication with Friends and Family:   . Frequency of Social Gatherings with Friends and Family:   . Attends Religious Services:   . Active Member of Clubs or Organizations:   . Attends Banker Meetings:   Martha Kitchen Marital Status:   Stress:   .  Feeling of Stress :   Tobacco Use: Unknown  . Smoking Tobacco Use: Never Smoker  . Smokeless Tobacco Use: Unknown  Transportation Needs:   . Lack of Transportation (Medical):   Martha Kitchen Lack of Transportation (Non-Medical):     Has this patient used any form of tobacco in the last 30 days? (Cigarettes, Smokeless Tobacco, Cigars, and/or Pipes) Prescription not provided because: doesn't smoke  Current Medications:  Current Facility-Administered Medications  Medication Dose Route Frequency Provider Last Rate Last Admin  . acetaminophen (TYLENOL) tablet 650 mg  650 mg Oral Q6H PRN Nira Conn A, NP      . ALPRAZolam Prudy Feeler) tablet 0.25 mg  0.25 mg Oral BID PRN Brisia Schuermann, Gerlene Burdock, FNP      . alum & mag hydroxide-simeth (MAALOX/MYLANTA) 200-200-20 MG/5ML suspension 30 mL  30 mL Oral Q4H PRN Nira Conn A, NP      . cloZAPine (CLOZARIL) tablet 250 mg  250 mg Oral BID Nira Conn A, NP   250 mg at 02/19/20 2211  . divalproex (DEPAKOTE ER) 24 hr tablet 500 mg  500 mg Oral BID Nira Conn A, NP   500 mg at 02/19/20 2125  . docusate sodium (COLACE) capsule 100 mg  100 mg Oral QHS Nira Conn A, NP   100 mg at 02/19/20 2125  . folic acid (FOLVITE) tablet 1 mg  1 mg Oral Daily Jackelyn Poling, NP      . Levonorgestrel-Ethinyl Estradiol (AMETHIA) 0.15-0.03 &0.01 MG tablet 1 tablet  1 tablet Oral Daily Nira Conn A, NP      . magnesium hydroxide (MILK OF MAGNESIA) suspension 30 mL  30 mL Oral Daily PRN Nira Conn A, NP      . melatonin tablet 5 mg  5 mg Oral QHS Nira Conn A, NP   5 mg at 02/19/20 2125  . multivitamin with minerals tablet 1 tablet  1 tablet Oral Daily Nira Conn A, NP      . senna (SENOKOT) tablet 17.2 mg  2 tablet Oral QPM Jackelyn Poling, NP       Current Outpatient Medications  Medication Sig Dispense Refill  . ALPRAZolam (XANAX) 0.25 MG tablet Take 0.25 mg by mouth 2 (two) times daily as needed.    . cloZAPine (CLOZARIL) 100 MG tablet Take 250 mg by mouth 2 (two) times daily.     . divalproex (DEPAKOTE ER) 500 MG 24 hr tablet Take 500 mg by mouth 2 (two) times daily.    Martha Kitchen docusate sodium (COLACE) 100 MG capsule Take 100 mg by mouth at bedtime.    . folic acid (FOLVITE) 1 MG tablet Take 1 mg by mouth daily.    Martha Kitchen levOCARNitine L-Tartrate (L-CARNITINE) 500 MG CAPS Take 1,000 mg by mouth in the morning and at bedtime.    . melatonin 5 MG TABS Take 5 mg by mouth at bedtime.     . Multiple Vitamin (MULTIVITAMIN ADULT PO) Take 1 tablet by mouth daily.    Martha Kitchen senna (SENOKOT) 8.6 MG TABS tablet Take 2 tablets by mouth every evening.    Martha Kitchen SIMPESSE 0.15-0.03 &0.01 MG tablet Take 1 tablet by mouth daily.      PTA Medications: (Not in a hospital admission)   Musculoskeletal  Strength & Muscle Tone: within normal limits Gait & Station: normal Patient leans: N/A  Psychiatric Specialty Exam  Presentation  General Appearance: Appropriate for Environment;Casual  Eye Contact:Fair  Speech:Clear and Coherent;Normal Rate  Speech Volume:Normal  Handedness:Right  Mood and Affect  Mood:Euthymic  Affect:Congruent   Thought Process  Thought Processes:Coherent  Descriptions of Associations:Intact  Orientation:Full (Time, Place and Person)  Thought Content:WDL  Hallucinations:Hallucinations: None Description of Visual Hallucinations: states that she saw a character with a wide mouth when she first arrived to [facility]. States now that she is tired and sleepy she does not have any hallucinations  Ideas of Reference:None  Suicidal  Thoughts:Suicidal Thoughts: No  Homicidal Thoughts:Homicidal Thoughts: No   Sensorium  Memory:Immediate Good;Recent Good;Remote Good  Judgment:Fair  Insight:Lacking   Executive Functions  Concentration:Fair  Attention Span:Fair  Recall:Fair  Fund of Knowledge:Fair  Language:Fair   Psychomotor Activity  Psychomotor Activity:Psychomotor Activity: Normal   Assets  Assets:Communication Skills;Desire for Improvement;Financial Resources/Insurance;Housing;Social Support;Transportation   Sleep  Sleep:Sleep: Good   Physical Exam  Physical Exam Vitals and nursing note reviewed.  Constitutional:      Appearance: She is well-developed.  Cardiovascular:     Rate and Rhythm: Tachycardia present.  Pulmonary:     Effort: Pulmonary effort is normal.  Musculoskeletal:        General: Normal range of motion.  Skin:    General: Skin is warm.  Neurological:     Mental Status: She is alert and oriented to person, place, and time.    Review of Systems  Constitutional: Negative.   HENT: Negative.   Eyes: Negative.   Respiratory: Negative.   Cardiovascular: Negative.   Gastrointestinal: Negative.   Genitourinary: Negative.   Musculoskeletal: Negative.   Skin: Negative.   Neurological: Negative.   Endo/Heme/Allergies: Negative.   Psychiatric/Behavioral: Negative.    Blood pressure 103/76, pulse (!) 131, temperature 97.7 F (36.5 C), temperature source Temporal, resp. rate 16, last menstrual period 02/04/2020, SpO2 97 %. There is no height or weight on file to calculate BMI.  Demographic Factors:  Adolescent or young adult and Caucasian  Loss Factors: NA  Historical Factors: Prior suicide attempts and Impulsivity  Risk Reduction Factors:   Sense of responsibility to family, Living with another person, especially a relative, Positive social support, Positive therapeutic relationship and Positive coping skills or problem solving skills  Continued Clinical  Symptoms:  Previous Psychiatric Diagnoses and Treatments  Cognitive Features That Contribute To Risk:  None    Suicide Risk:  Mild:  Suicidal ideation of limited frequency, intensity, duration, and specificity.  There are no identifiable plans, no associated intent, mild dysphoria and related symptoms, good self-control (both objective and subjective assessment), few other risk factors, and identifiable protective factors, including available and accessible social support.  Plan Of Care/Follow-up recommendations:  Continue activity as tolerated. Continue diet as recommended by your PCP. Ensure to keep all appointments with outpatient providers.  Disposition: Discharge home with legal guardian, grandfather. Follow up with outpatient provider. Continue current home medications.  Gerlene Burdock Kenesha Moshier, FNP 02/20/2020, 8:41 AM

## 2020-02-20 NOTE — ED Notes (Signed)
Pt given breakfast; cereal, muffin and milk.

## 2020-02-20 NOTE — Discharge Instructions (Addendum)
Keep scheduled appointments Continue current home medications as prescribed

## 2020-02-20 NOTE — ED Notes (Signed)
Pt given snack upon request °

## 2020-02-20 NOTE — ED Notes (Signed)
Pt stable at time of d/c. Escorted pt out to guardian. Went over avs with guardian. Guardian verbalized understanding.

## 2020-02-20 NOTE — ED Notes (Signed)
Patient alert, and verbal. Patient denies SI/HI and A/V/H. Patient is interacting with peers and staff. Patient provided support and encouragement. Monitoring continues.

## 2020-02-20 NOTE — ED Notes (Signed)
Patient is resting with closed. Respirations even and non labored. No distress noted. Monitoring continues.

## 2021-12-29 ENCOUNTER — Ambulatory Visit: Payer: Medicaid Other | Admitting: Nurse Practitioner

## 2021-12-29 ENCOUNTER — Other Ambulatory Visit: Payer: Self-pay

## 2021-12-29 ENCOUNTER — Telehealth: Payer: Self-pay

## 2021-12-29 ENCOUNTER — Encounter: Payer: Self-pay | Admitting: Nurse Practitioner

## 2021-12-29 VITALS — BP 110/72 | HR 95 | Temp 97.7°F | Resp 18 | Wt 158.2 lb

## 2021-12-29 DIAGNOSIS — F840 Autistic disorder: Secondary | ICD-10-CM

## 2021-12-29 DIAGNOSIS — Z7689 Persons encountering health services in other specified circumstances: Secondary | ICD-10-CM | POA: Diagnosis not present

## 2021-12-29 DIAGNOSIS — F3281 Premenstrual dysphoric disorder: Secondary | ICD-10-CM

## 2021-12-29 DIAGNOSIS — R000 Tachycardia, unspecified: Secondary | ICD-10-CM

## 2021-12-29 DIAGNOSIS — F250 Schizoaffective disorder, bipolar type: Secondary | ICD-10-CM

## 2021-12-29 DIAGNOSIS — F849 Pervasive developmental disorder, unspecified: Secondary | ICD-10-CM

## 2021-12-29 DIAGNOSIS — G2401 Drug induced subacute dyskinesia: Secondary | ICD-10-CM | POA: Diagnosis not present

## 2021-12-29 NOTE — Progress Notes (Signed)
BP 110/72   Pulse 95   Temp 97.7 F (36.5 C) (Oral)   Resp 18   Wt 158 lb 3.2 oz (71.8 kg)   LMP  (LMP Unknown)   SpO2 98%   BMI 31.95 kg/m    Subjective:    Patient ID: Martha Harrison, female    DOB: 03-04-2001, 21 y.o.   MRN: [id number]  HPI: Martha Harrison is a 21 y.o. female, here with mom  Chief Complaint  Patient presents with   Establish Care   Establish care: Last physical about a year ago but did have a sports physical in April.  Everything was fine then.  Discussed with patient and mother that we would be getting lab work at next visit.  PMDD: Currently taking simpesse. Mom reports patient becomes violent when she has her period.  She has been taking simpesse. They are looking for treatment that will prevent menstrual cycle completely.  We will place referral to GYN.  History of Tachycardia: Mother reports that since her overdose in 2007.  Patient has episodes of tachycardia.  Upon her arrival today her heart rate was 110 during assessment her heart rate was down to 95.  Patient is not complaining of any chest pain, palpitations or shortness of breath at this time.  Tardive dyskinesia: Patient states that sometimes her left arm jerks and she has a tic in her head.  Mom states that she does have tardive dyskinesia.  You are trying to avoid taking medications for this condition.  Pervasive development disorder/autism. Bipolar/schizoaffective disorder: Dr.Gualtieri from [facility]. Zoom calls every three months.  In 2007 had a suicide attempt where she took a whole bunch of pills.  She was hospitalized and diagnosed with schizoaffective disorder.  Patient states that she did see things that were not there and heard voices.  Mother reports patient has been doing much better since her diagnosis.  Taking clozapine 250 mg 2 times a day, Lamictal 200 mg daily, Zoloft 50 mg daily, propanolol 20 mg 2 times a day.     12/29/2021   10:07 AM 12/29/2021    9:53 AM   Depression screen PHQ 2/9  Decreased Interest 0 0  Down, Depressed, Hopeless 0 0  PHQ - 2 Score 0 0  Altered sleeping  0  Tired, decreased energy  0  Change in appetite  0  Feeling bad or failure about yourself   0  Trouble concentrating  0  Moving slowly or fidgety/restless  0  Suicidal thoughts  0  PHQ-9 Score  0  Difficult doing work/chores  Not difficult at all     Relevant past medical, surgical, family and social history reviewed and updated as indicated. Interim medical history since our last visit reviewed. Allergies and medications reviewed and updated.  Review of Systems  Constitutional: Negative for fever or weight change.  Respiratory: Negative for cough and shortness of breath.   Cardiovascular: Negative for chest pain or palpitations.  Gastrointestinal: Negative for abdominal pain, no bowel changes.  Musculoskeletal: Negative for gait problem or joint swelling.  Skin: Negative for rash.  Neurological: Negative for dizziness or headache.  No other specific complaints in a complete review of systems (except as listed in HPI above).      Objective:    BP 110/72   Pulse 95   Temp 97.7 F (36.5 C) (Oral)   Resp 18   Wt 158 lb 3.2 oz (71.8 kg)   LMP  (LMP Unknown)  SpO2 98%   BMI 31.95 kg/m   Wt Readings from Last 3 Encounters:  12/29/21 158 lb 3.2 oz (71.8 kg)  06/14/16 100 lb (45.4 kg) (8 %, Z= -1.41)*  12/02/15 102 lb 6.4 oz (46.4 kg) (14 %, Z= -1.08)*   * Growth percentiles are based on CDC (Girls, 2-20 Years) data.    Physical Exam  Constitutional: Patient appears well-developed and well-nourished.  No distress.  HEENT: head atraumatic, normocephalic, pupils equal and reactive to light, neck supple Cardiovascular: Normal rate, regular rhythm and normal heart sounds.  No murmur heard. No BLE edema. Pulmonary/Chest: Effort normal and breath sounds normal. No respiratory distress. Abdominal: Soft.  There is no tenderness. Psychiatric: Patient has a  normal mood and affect. behavior is normal. Judgment and thought content normal.  Results for orders placed or performed during the hospital encounter of 02/18/20  SARS Coronavirus 2 by RT PCR (hospital order, performed in [facility] hospital lab) Nasopharyngeal Nasopharyngeal Swab   Specimen: Nasopharyngeal Swab  Result Value Ref Range   SARS Coronavirus 2 NEGATIVE NEGATIVE  Comprehensive metabolic panel  Result Value Ref Range   Sodium 140 135 - 145 mmol/L   Potassium 3.8 3.5 - 5.1 mmol/L   Chloride 106 98 - 111 mmol/L   CO2 21 (L) 22 - 32 mmol/L   Glucose, Bld 88 70 - 99 mg/dL   BUN 11 6 - 20 mg/dL   Creatinine, Ser 5.64 0.44 - 1.00 mg/dL   Calcium 9.3 8.9 - 33.2 mg/dL   Total Protein 7.2 6.5 - 8.1 g/dL   Albumin 3.8 3.5 - 5.0 g/dL   AST 19 15 - 41 U/L   ALT 16 0 - 44 U/L   Alkaline Phosphatase 51 38 - 126 U/L   Total Bilirubin 0.4 0.3 - 1.2 mg/dL   GFR calc non Af Amer >60 >60 mL/min   GFR calc Af Amer >60 >60 mL/min   Anion gap 13 5 - 15  Ethanol  Result Value Ref Range   Alcohol, Ethyl (B) <10 <10 mg/dL  Salicylate level  Result Value Ref Range   Salicylate Lvl <7.0 (L) 7.0 - 30.0 mg/dL  Acetaminophen level  Result Value Ref Range   Acetaminophen (Tylenol), Serum <10 (L) 10 - 30 ug/mL  cbc  Result Value Ref Range   WBC 7.8 4.0 - 10.5 K/uL   RBC 4.77 3.87 - 5.11 MIL/uL   Hemoglobin 14.1 12.0 - 15.0 g/dL   HCT 95.1 88.4 - 16.6 %   MCV 90.4 80.0 - 100.0 fL   MCH 29.6 26.0 - 34.0 pg   MCHC 32.7 30.0 - 36.0 g/dL   RDW 06.3 01.6 - 01.0 %   Platelets 159 150 - 400 K/uL   nRBC 0.0 0.0 - 0.2 %  Rapid urine drug screen (hospital performed)  Result Value Ref Range   Opiates NONE DETECTED NONE DETECTED   Cocaine NONE DETECTED NONE DETECTED   Benzodiazepines NONE DETECTED NONE DETECTED   Amphetamines NONE DETECTED NONE DETECTED   Tetrahydrocannabinol NONE DETECTED NONE DETECTED   Barbiturates NONE DETECTED NONE DETECTED  Valproic acid level  Result Value Ref Range    Valproic Acid Lvl 34 (L) 50.0 - 100.0 ug/mL  CBC with Differential/Platelet  Result Value Ref Range   WBC 7.8 4.0 - 10.5 K/uL   RBC 4.34 3.87 - 5.11 MIL/uL   Hemoglobin 12.7 12.0 - 15.0 g/dL   HCT 93.2 35.5 - 73.2 %   MCV 89.4 80.0 - 100.0  fL   MCH 29.3 26.0 - 34.0 pg   MCHC 32.7 30.0 - 36.0 g/dL   RDW 12.4 30.811.5 - 65.715.5 %62.1   Platelets 165 150 - 400 K/uL   nRBC 0.0 0.0 - 0.2 %   Neutrophils Relative % 48 %   Neutro Abs 3.8 1.7 - 7.7 K/uL   Lymphocytes Relative 40 %   Lymphs Abs 3.1 0.7 - 4.0 K/uL   Monocytes Relative 9 %   Monocytes Absolute 0.7 0.1 - 1.0 K/uL   Eosinophils Relative 2 %   Eosinophils Absolute 0.1 0.0 - 0.5 K/uL   Basophils Relative 1 %   Basophils Absolute 0.1 0.0 - 0.1 K/uL   Immature Granulocytes 0 %   Abs Immature Granulocytes 0.02 0.00 - 0.07 K/uL  I-Stat beta hCG blood, ED  Result Value Ref Range   I-stat hCG, quantitative <5.0 <5 mIU/mL   Comment 3              Assessment & Plan:   Problem List Items Addressed This Visit       Other   Pervasive developmental disorder    Continue following care plan from neuropsychiatry.  Continue taking Zoloft 50 mg daily, propranolol 20 mg 2 times a day, Lamictal 200 mg daily, and call clozapine 250mg  bid.       Autism spectrum    Continue following care plan from neuropsychiatry.  Continue taking Zoloft 50 mg daily, propranolol 20 mg 2 times a day, Lamictal 200 mg daily, and call clozapine 250mg  bid.       Schizoaffective disorder, bipolar type [facility](HCC)    Continue following care plan from neuropsychiatry.  Continue taking Zoloft 50 mg daily, propranolol 20 mg 2 times a day, Lamictal 200 mg daily, and call clozapine 250mg  bid.       PMDD (premenstrual dysphoric disorder) - Primary    Mother reports patient is violent when she does have a menstrual cycle.  Like something stronger for birth control.  Referral placed to GYN.      Relevant Medications   sertraline (ZOLOFT) 50 MG tablet   Other Relevant Orders    Ambulatory referral to Gynecology   Other Visit Diagnoses     Tachycardia       Heart rate was 110 decreased to 95.  Patient denies any chest pain, shortness of breath or palpitations.   Encounter to establish care       Schedule for CPE   Tardive dyskinesia            Follow up plan: Return in about 3 months (around 03/31/2022) for cpe.

## 2021-12-29 NOTE — Assessment & Plan Note (Signed)
Continue following care plan from neuropsychiatry.  Continue taking Zoloft 50 mg daily, propranolol 20 mg 2 times a day, Lamictal 200 mg daily, and call clozapine 250mg bid. 

## 2021-12-29 NOTE — Assessment & Plan Note (Signed)
Mother reports patient is violent when she does have a menstrual cycle.  Like something stronger for birth control.  Referral placed to GYN.

## 2021-12-29 NOTE — Telephone Encounter (Signed)
[facility] referring for PMDD (premenstrual dysphoric disorder). Birth control consult. Sch with any provider. Called and left voicemail for patient to call back to be scheduled.

## 2021-12-29 NOTE — Assessment & Plan Note (Signed)
Continue following care plan from neuropsychiatry.  Continue taking Zoloft 50 mg daily, propranolol 20 mg 2 times a day, Lamictal 200 mg daily, and call clozapine 250mg  bid.

## 2021-12-30 NOTE — Telephone Encounter (Signed)
Patient is scheduled for 01/19/22 with JC 

## 2022-01-12 ENCOUNTER — Telehealth: Payer: Self-pay | Admitting: Obstetrics

## 2022-01-12 NOTE — Telephone Encounter (Signed)
Pt's grandmother did not want to move the appointment to 6/19.  She is keeping the appointment for 6/27.

## 2022-01-12 NOTE — Telephone Encounter (Signed)
Patient is scheduled to see Dr Okey Dupre on 01/20/22. Left message to see if patient would like to be seen on 01/12/22

## 2022-01-19 ENCOUNTER — Encounter: Payer: Self-pay | Admitting: Obstetrics

## 2022-01-20 ENCOUNTER — Encounter: Payer: Self-pay | Admitting: Obstetrics

## 2022-02-10 ENCOUNTER — Encounter: Payer: Self-pay | Admitting: Family Medicine

## 2022-02-10 ENCOUNTER — Ambulatory Visit (INDEPENDENT_AMBULATORY_CARE_PROVIDER_SITE_OTHER): Payer: Medicaid Other | Admitting: Family Medicine

## 2022-02-10 VITALS — BP 98/58 | HR 94 | Ht 62.0 in | Wt 158.0 lb

## 2022-02-10 DIAGNOSIS — Z3041 Encounter for surveillance of contraceptive pills: Secondary | ICD-10-CM

## 2022-02-10 MED ORDER — SIMPESSE 0.15-0.03 &0.01 MG PO TABS: 1.0000 | ORAL_TABLET | Freq: Every day | ORAL | 5 refills | Status: AC

## 2022-02-10 NOTE — Progress Notes (Signed)
   GYNECOLOGY PROBLEM  VISIT ENCOUNTER NOTE  Subjective:   Martha Harrison is a 21 y.o. G0P0000 female here for a problem GYN visit.  Current complaints: PCP is not comfortable with continuous dosing OCP and also patient needs to establish care for pelvic exams.   Patient is not sexually active. Patient has behavioral disruption with cycle and cannot self care with cycle.   Denies abnormal vaginal bleeding, discharge, pelvic pain, problems with intercourse or other gynecologic concerns.    Gynecologic History No LMP recorded.  Contraception: OCP (estrogen/progesterone)  Health Maintenance Due  Topic Date Due   HPV VACCINES (1 - 2-dose series) Never done   HIV Screening  Never done   Hepatitis C Screening  Never done   PAP-Cervical Cytology Screening  Never done   PAP SMEAR-Modifier  Never done    The following portions of the patient's history were reviewed and updated as appropriate: allergies, current medications, past family history, past medical history, past social history, past surgical history and problem list.  Review of Systems Pertinent items are noted in HPI.   Objective:  BP (!) 98/58 (Patient Position: Sitting, Cuff Size: Normal)   Pulse 94   Ht 5\' 2"  (1.575 m)   Wt 158 lb (71.7 kg)   BMI 28.90 kg/m  Gen: well appearing, NAD HEENT: no scleral icterus CV: RR Lung: Normal WOB Ext: warm well perfused  PELVIC: shared decision making with patient and mother. Patient has not started having sex and had HPV vaccination at peds office. Will defer pap until next year   Assessment and Plan:  1. Encounter for surveillance of contraceptive pills Reviewed relative safety of continuous dosing Reviewed possible once yearly withdrawal bleeding but also reasonable to skip cycles.  Patient is not on unopposed estrogen  Reviewed depo amenorrhea profile with patient and parent.  - SIMPESSE 0.15-0.03 &0.01 MG tablet; Take 1 tablet by mouth daily.  Dispense: 90 tablet; Refill:  5  Health maintenance: - Recommend pap smear next year-- patient in agreement and happy with this plan  Please refer to After Visit Summary for other counseling recommendations.   Return in about 1 year (around 02/11/2023) for Yearly wellness exam.  02/13/2023, MD, MPH, ABFM Attending Physician [facility]
